# Patient Record
Sex: Female | Born: 1995 | Race: White | Hispanic: No | Marital: Single | Smoking: Never smoker
Health system: Southern US, Community
[De-identification: ages and names within clinical notes are randomized; demographics above are authoritative.]

## PROBLEM LIST (undated history)

## (undated) DIAGNOSIS — J45909 Unspecified asthma, uncomplicated: Secondary | ICD-10-CM

---
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
## 2018-08-29 ENCOUNTER — Encounter (HOSPITAL_COMMUNITY): Payer: Self-pay | Admitting: Pharmacy Technician

## 2018-08-29 ENCOUNTER — Emergency Department (HOSPITAL_COMMUNITY)
Admission: EM | Admit: 2018-08-29 | Discharge: 2018-08-29 | Disposition: A | Discharge status: Home / Self Care | Payer: BLUE CROSS/BLUE SHIELD | Attending: Emergency Medicine | Admitting: Emergency Medicine

## 2018-08-29 ENCOUNTER — Other Ambulatory Visit: Payer: Self-pay

## 2018-08-29 DIAGNOSIS — N938 Other specified abnormal uterine and vaginal bleeding: Secondary | ICD-10-CM | POA: Insufficient documentation

## 2018-08-29 DIAGNOSIS — J45909 Unspecified asthma, uncomplicated: Secondary | ICD-10-CM | POA: Diagnosis not present

## 2018-08-29 DIAGNOSIS — N939 Abnormal uterine and vaginal bleeding, unspecified: Secondary | ICD-10-CM | POA: Diagnosis present

## 2018-08-29 DIAGNOSIS — Z79899 Other long term (current) drug therapy: Secondary | ICD-10-CM | POA: Diagnosis not present

## 2018-08-29 LAB — PREGNANCY, URINE: Preg Test, Ur: NEGATIVE

## 2018-08-29 NOTE — ED Provider Notes (Signed)
[facility] Provider Note   CSN: [id number] Arrival date & time: 08/29/18  1628     History   Chief Complaint Chief Complaint  Patient presents with  . Possible Pregnancy    HPI Rhonda Mcfarland is a 22 y.o. female.  Patient presents with lower abd cramping, and vaginal bleeding, states last normal menstrual period was end Oct, light bleeding for 4 days. Then did not have period in November, and 2 days ago onset cramping and bleeding, slightly heavier than normal period. Pt went to urgent care and was referred to ER - no preg test done there. Patient denies faintness/dizziness. No dysuria. No unusual vaginal discharge. No fever or chills. Pt had been on DEPO shot up until 1 yr ago, currently on no birth control.   The history is provided by the patient.  Possible Pregnancy  Pertinent negatives include no abdominal pain and no headaches.    Past Medical History:  Diagnosis Date  . Asthma     There are no active problems to display for this patient.   History reviewed. No pertinent surgical history.   OB History   No obstetric history on file.      Home Medications    Prior to Admission medications   Medication Sig Start Date End Date Taking? Authorizing Provider  albuterol (PROVENTIL HFA;VENTOLIN HFA) 108 (90 BASE) MCG/ACT inhaler Inhale 2 puffs into the lungs every 6 (six) hours as needed for wheezing or shortness of breath.    [provider]  ibuprofen (ADVIL,MOTRIN) 800 MG tablet Take 1 tablet (800 mg total) by mouth 3 (three) times daily. 04/11/15   Elson AreasSofia, Leslie K, PA-C  medroxyPROGESTERone (DEPO-PROVERA) 150 MG/ML injection Inject 150 mg into the muscle every 3 (three) months. 01/28/15   [provider]    Family History No family history on file.  Social History Social History   Tobacco Use  . Smoking status: Never Smoker  Substance Use Topics  . Alcohol use: No  . Drug use: No     Allergies    Patient has no known allergies.   Review of Systems Review of Systems  Constitutional: Negative for fever.  Gastrointestinal: Negative for abdominal pain and vomiting.  Genitourinary: Positive for vaginal bleeding. Negative for dysuria and flank pain.  Musculoskeletal: Negative for back pain.  Skin: Negative for rash.  Neurological: Negative for headaches.  Hematological: Does not bruise/bleed easily.  Psychiatric/Behavioral: Negative for confusion.     Physical Exam Updated Vital Signs BP 96/74 (BP Location: Right Arm)   Pulse 64   Temp 98.2 F (36.8 C) (Oral)   Resp 16   Ht 1.6 m (5\' 3" )   Wt 59 kg   SpO2 100%   BMI 23.03 kg/m   Physical Exam Vitals signs and nursing note reviewed.  Constitutional:      Appearance: Normal appearance. She is well-developed.  HENT:     Head: Atraumatic.  Eyes:     General: No scleral icterus.    Conjunctiva/sclera: Conjunctivae normal.  Neck:     Musculoskeletal: Neck supple.     Trachea: No tracheal deviation.  Cardiovascular:     Rate and Rhythm: Normal rate.  Pulmonary:     Effort: Pulmonary effort is normal. No respiratory distress.  Abdominal:     General: Abdomen is flat. Bowel sounds are normal. There is no distension.     Palpations: Abdomen is soft. There is no mass.     Tenderness: There is  no abdominal tenderness.  Genitourinary:    Comments: No cva tenderness. Pelvic exam: small amount of dark blood in vagina, no tissue, cervix closed, no cmt, no adx mass/tenderness.  Skin:    General: Skin is warm and dry.     Findings: No rash.  Neurological:     Mental Status: She is alert.     Comments: Alert. Speech normal.   Psychiatric:        Mood and Affect: Mood normal.      ED Treatments / Results  Labs (all labs ordered are listed, but only abnormal results are displayed) Results for orders placed or performed during the hospital encounter of 08/29/18  Pregnancy, urine  Result Value Ref Range   Preg Test,  Ur NEGATIVE NEGATIVE    EKG None  Radiology No results found.  Procedures Procedures (including critical care time)  Medications Ordered in ED Medications - No data to display   Initial Impression / Assessment and Plan / ED Course  I have reviewed the triage vital signs and the nursing notes.  Pertinent labs & imaging results that were available during my care of the patient were reviewed by me and considered in my medical decision making (see chart for details).  Labs sent.   Reviewed nursing notes and prior charts for additional history.   Pt indicates she had previous female/pelvic exam in her past - no exam done at urgent care today. Pelvic unremarkable, small amt blood.   Labs reviewed - u preg neg.   abd soft nt.   rec outpt ob/gyn f/u.   Pt currently appears stable for d/c.     Final Clinical Impressions(s) / ED Diagnoses   Final diagnoses:  None    ED Discharge Orders    None       Cathren LaineSteinl, Marco Raper, MD 08/29/18 1725

## 2018-08-29 NOTE — ED Triage Notes (Signed)
Pt reports having a late menstrual period. Started with heavy cramping and clotting yesterday. Unsure if miscarrying.

## 2018-08-29 NOTE — ED Notes (Signed)
Patient verbalizes understanding of discharge instructions. Opportunity for questioning and answers were provided. 

## 2018-08-29 NOTE — Discharge Instructions (Signed)
It was our pleasure to provide your ER care today - we hope that you feel better.  The pregnancy test is negative, and your female/pelvic exam normal.   Take acetaminophen and/or ibuprofen as need for cramping.   Follow up with ob/gyn doctor in the next few weeks.  Return to ER if worse, new symptoms, fevers, worsening/severe pain, other concern.
